# Patient Record
Sex: Female | Born: 1985 | Race: White | Hispanic: No | Marital: Single | Smoking: Current every day smoker
Health system: Southern US, Community
[De-identification: ages and names within clinical notes are randomized; demographics above are authoritative.]

---

## 2007-12-04 ENCOUNTER — Emergency Department (HOSPITAL_COMMUNITY): Admission: EM | Admit: 2007-12-04 | Discharge: 2007-12-04 | Payer: Self-pay | Admitting: Emergency Medicine

## 2008-05-24 ENCOUNTER — Emergency Department (HOSPITAL_COMMUNITY): Admission: EM | Admit: 2008-05-24 | Discharge: 2008-05-24 | Payer: Self-pay | Admitting: Emergency Medicine

## 2008-05-28 ENCOUNTER — Emergency Department (HOSPITAL_COMMUNITY): Admission: EM | Admit: 2008-05-28 | Discharge: 2008-05-29 | Payer: Self-pay | Admitting: Family Medicine

## 2010-03-30 ENCOUNTER — Emergency Department (HOSPITAL_COMMUNITY): Admission: EM | Admit: 2010-03-30 | Discharge: 2010-03-30 | Payer: Self-pay | Admitting: Emergency Medicine
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
## 2011-03-09 LAB — POCT PREGNANCY, URINE: Preg Test, Ur: NEGATIVE

## 2014-09-05 ENCOUNTER — Emergency Department (HOSPITAL_COMMUNITY)
Admission: EM | Admit: 2014-09-05 | Discharge: 2014-09-06 | Disposition: A | Discharge status: Home / Self Care | Payer: Worker's Compensation | Attending: Emergency Medicine | Admitting: Emergency Medicine

## 2014-09-05 ENCOUNTER — Encounter (HOSPITAL_COMMUNITY): Payer: Self-pay | Admitting: *Deleted

## 2014-09-05 DIAGNOSIS — Z23 Encounter for immunization: Secondary | ICD-10-CM | POA: Insufficient documentation

## 2014-09-05 DIAGNOSIS — Z720 Tobacco use: Secondary | ICD-10-CM | POA: Insufficient documentation

## 2014-09-05 DIAGNOSIS — S61452A Open bite of left hand, initial encounter: Secondary | ICD-10-CM | POA: Diagnosis not present

## 2014-09-05 DIAGNOSIS — Y998 Other external cause status: Secondary | ICD-10-CM | POA: Diagnosis not present

## 2014-09-05 DIAGNOSIS — W5501XA Bitten by cat, initial encounter: Secondary | ICD-10-CM | POA: Diagnosis not present

## 2014-09-05 DIAGNOSIS — Y9389 Activity, other specified: Secondary | ICD-10-CM | POA: Insufficient documentation

## 2014-09-05 DIAGNOSIS — Y9289 Other specified places as the place of occurrence of the external cause: Secondary | ICD-10-CM | POA: Insufficient documentation

## 2014-09-05 NOTE — ED Notes (Signed)
The pt  Was bitten by a cat on the lt hand 1215 today at the  [facility] office where she works.  Her hand is painful and sl swollen  lmp  2 weeks ago

## 2014-09-05 NOTE — ED Notes (Signed)
Pt c/o cat bite to L hand. Several puncture wounds noted, no bleeding, mild swelling noted. 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 CMS intact. Pt cleaned wounds with betadine

## 2014-09-06 MED ORDER — RABIES VACCINE, PCEC IM SUSR
1.0000 mL | Freq: Once | INTRAMUSCULAR | Status: AC
  Administered 2014-09-06: 1 mL via INTRAMUSCULAR
  Filled 2014-09-06: qty 1

## 2014-09-06 MED ORDER — RABIES IMMUNE GLOBULIN 150 UNIT/ML IM INJ
1800.0000 [IU] | INJECTION | Freq: Once | INTRAMUSCULAR | Status: AC
  Administered 2014-09-06: 1800 [IU]
  Filled 2014-09-06: qty 12

## 2014-09-06 MED ORDER — TETANUS-DIPHTH-ACELL PERTUSSIS 5-2.5-18.5 LF-MCG/0.5 IM SUSP
0.5000 mL | Freq: Once | INTRAMUSCULAR | Status: AC
  Administered 2014-09-06: 0.5 mL via INTRAMUSCULAR
  Filled 2014-09-06: qty 0.5

## 2014-09-06 MED ORDER — AMOXICILLIN-POT CLAVULANATE 875-125 MG PO TABS
1.0000 | ORAL_TABLET | Freq: Once | ORAL | Status: AC
  Administered 2014-09-06: 1 via ORAL
  Filled 2014-09-06: qty 1

## 2014-09-06 MED ORDER — AMOXICILLIN-POT CLAVULANATE 875-125 MG PO TABS: 1.0000 | ORAL_TABLET | Freq: Two times a day (BID) | ORAL | Status: AC

## 2014-09-06 NOTE — Discharge Instructions (Signed)
Animal Bite °An animal bite can result in a scratch on the skin, deep open cut, puncture of the skin, crush injury, or tearing away of the skin or a body part. Dogs are responsible for most animal bites. Children are bitten more often than adults. An animal bite can range from very mild to more serious. A small bite from your house pet is no cause for alarm. However, some animal bites can become infected or injure a bone or other tissue. You must seek medical care if: °· The skin is broken and bleeding does not slow down or stop after 15 minutes. °· The puncture is deep and difficult to clean (such as a cat bite). °· Pain, warmth, redness, or pus develops around the wound. °· The bite is from a stray animal or rodent. There may be a risk of rabies infection. °· The bite is from a snake, raccoon, skunk, fox, coyote, or bat. There may be a risk of rabies infection. °· The person bitten has a chronic illness such as diabetes, liver disease, or cancer, or the person takes medicine that lowers the immune system. °· There is concern about the location and severity of the bite. °It is important to clean and protect an animal bite wound right away to prevent infection. Follow these steps: °· Clean the wound with plenty of water and soap. °· Apply an antibiotic cream. °· Apply gentle pressure over the wound with a clean towel or gauze to slow or stop bleeding. °· Elevate the affected area above the heart to help stop any bleeding. °· Seek medical care. Getting medical care within 8 hours of the animal bite leads to the best possible outcome. °DIAGNOSIS  °Your caregiver will most likely: °· Take a detailed history of the animal and the bite injury. °· Perform a wound exam. °· Take your medical history. °Blood tests or X-rays may be performed. Sometimes, infected bite wounds are cultured and sent to a lab to identify the infectious bacteria.  °TREATMENT  °Medical treatment will depend on the location and type of animal bite as  well as the patient's medical history. Treatment may include: °· Wound care, such as cleaning and flushing the wound with saline solution, bandaging, and elevating the affected area. °· Antibiotics. °· Tetanus immunization. °· Rabies immunization. °· Leaving the wound open to heal. This is often done with animal bites, due to the high risk of infection. However, in certain cases, wound closure with stitches, wound adhesive, skin adhesive strips, or staples may be used. ° Infected bites that are left untreated may require intravenous (IV) antibiotics and surgical treatment in the hospital. °HOME CARE INSTRUCTIONS °· Follow your caregiver's instructions for wound care. °· Take all medicines as directed. °· If your caregiver prescribes antibiotics, take them as directed. Finish them even if you start to feel better. °· Follow up with your caregiver for further exams or immunizations as directed. °You may need a tetanus shot if: °· You cannot remember when you had your last tetanus shot. °· You have never had a tetanus shot. °· The injury broke your skin. °If you get a tetanus shot, your arm may swell, get red, and feel warm to the touch. This is common and not a problem. If you need a tetanus shot and you choose not to have one, there is a rare chance of getting tetanus. Sickness from tetanus can be serious. °SEEK MEDICAL CARE IF: °· You notice warmth, redness, soreness, swelling, pus discharge, or a bad   smell coming from the wound.  You have a red line on the skin coming from the wound.  You have a fever, chills, or a general ill feeling.  You have nausea or vomiting.  You have continued or worsening pain.  You have trouble moving the injured part.  You have other questions or concerns. MAKE SURE YOU:  Understand these instructions.  Will watch your condition.  Will get help right away if you are not doing well or get worse. Document Released: 02/14/2011 Document Revised: 08/21/2011 Document  Reviewed: 02/14/2011 [facility] Patient Information 2015 [place], [place]. This information is not intended to replace advice given to you by your health care provider. Make sure you discuss any questions you have with your health care provider.   Rabies  Rabies is a viral infection that can be spread to people from infected animals. The infection affects the brain and central nervous system. Once the disease develops, it almost always causes death. Because of this, when a person is bitten by an animal that may have rabies, treatment to prevent rabies often needs to be started whether or not the animal is known to be infected. Prompt treatment with the rabies vaccine and rabies immune globulin is very effective at preventing the infection from developing in people who have been exposed to the rabies virus. CAUSES  Rabies is caused by a virus that lives inside some animals. When a person is bitten by an infected animal, the rabies virus is spread to the person through the infected spit (saliva) of the animal. This virus can be carried by animals such as dogs, cats, skunks, bats, woodchucks, raccoons, coyotes, and foxes. SYMPTOMS  By the time symptoms appear, rabies is usually fatal for the person. Common symptoms include:  Headache.  Fever.  Fatigue and weakness.  Agitation.  Anxiety.  Confusion.  Unusual behavior, such as hyperactivity, fear of water (hydrophobia), or fear of air (aerophobia).  Hallucinations.  Insomnia.  Weakness in the arms or legs.  Difficulty swallowing. Most people get sick in 1-3 months after being bitten. This often varies and may depend on the location of the bite. The infection will take less time to develop if the bite occurred closer to the head.  DIAGNOSIS  To determine if a person is infected, several tests must be performed, such as:  A skin biopsy.  A saliva test.  A lumbar puncture to remove spinal fluid so it can be examined.  Blood  tests. TREATMENT  Treatment to prevent the infection from developing (post-exposure prophylaxis, PEP) is often started before knowing for sure if the person has been exposed to the rabies virus. PEP involves cleaning the wound, giving an antibody injection (rabies immune globulin), and giving a series of rabies vaccine injections. The series of injections are usually given over a two-week period. If possible, the animal that bit the person will be observed to see if it remains healthy. If the animal has been killed, it can be sent to a state laboratory and examined to see if the animal had rabies. If a person is bitten by a domestic animal (dog, cat, or ferret) that appears healthy and can be observed to see if it remains healthy, often no further treatment is necessary other than care of the wounds caused by the animal. Rabies is often a fatal illness once the infection develops in a person. Although a few people who developed rabies have survived after experimental treatment with certain drugs, all these survivors still had severe  nervous system problems after the treatment. This is why caregivers use extra caution and begin PEP treatment for people who have been bitten by animals that are possibly infected with rabies.  HOME CARE INSTRUCTIONS  If you were bitten by an unknown animal, make sure you know your caregiver's instructions for follow-up. If the animal was sent to a laboratory for examination, ask when the test results will be ready. Make sure you get the test results.  Take these steps to care for your wound:  Keep the wound clean, dry, and dressed as directed by your caregiver.  Keep the injured part elevated as much as possible.  Do not resume use of the affected area until directed.  Only take over-the-counter or prescription medicines as directed by your caregiver.  Keep all follow-up appointments as directed by your caregiver. PREVENTION  To prevent rabies, people need to reduce  their risk of having contact with infected animals.   Make sure your pets (dogs, cats, ferrets) are vaccinated against rabies. Keep these vaccinations up-to-date as directed by your veterinarian.  Supervise your pets when they are outside. Keep them away from wild animals.  Call your local animal control services to report any stray animals. These animals may not be vaccinated.  Stay away from stray or wild animals.  Consider getting the rabies vaccine (preexposure) if you are traveling to an area where rabies is common or if your job or activities involve possible contact with wild or stray animals. Discuss this with your caregiver. Document Released: 05/29/2005 Document Revised: 02/21/2012 Document Reviewed: 12/26/2011 [facility] Patient Information 2015 [place], [place]. This information is not intended to replace advice given to you by your health care provider. Make sure you discuss any questions you have with your health care provider.      [address]                                             [place], [place] [address]              [phone]                                  INSTRUCTIONS FOR THE PATIENT  Patient's Name: Audrey Wyatt                     Original Order Date:  09/06/2014  Medical Record Number: [id number]  ED Physician: . Primary Diagnosis: Rabies Exposure       PCP: No PCP Per Patient . RABIES VACCINE:  Patient Phone Number: (home) [phone] (home)    (cell)  No relevant phone numbers on file.    (work) [phone] (work) Species of Animal: cats (1) IMMUNOGLOBULIN INJECTION GIVEN IN THE ER?: yes   You have been seen in the Emergency Department for a possible rabies exposure.  You must return for the additional vaccine doses to the Urgent Care Center ([address]) next to [facility].  If needed, your immunoglobulin follow-up injections, need to be scheduled for the dates below. If your first visit should fall on a weekend day, please  come anytime between the hours of 9am-7pm.  DAY 0:  Date 09/06/14     To: Urgent Care, or Emergency Department  DAY 3:  Date 09/09/14  To:  Urgent Care, or Emergency Department  DAY 7:  Date 09/13/14    To:  Urgent Care, or Emergency Department  DAY 14:  Date 09/20/14   To:  Urgent Care, or Emergency Department  The 5th vaccine injection is considered for immune compromised patients only.  DAY 28:  Date 10/04/14  To:  Urgent Care, or Emergency Department  The Urgent Care Center is open from 8am-8pm Monday thru Friday and 9am-7pm on Saturdays and Sundays.  There will be a minimal fee for the injection that will be billed to your insurance company along with the charge for the vaccine.                                                                                                Date: 09/06/2014  Patient Signature: _________________________________________________  [facility] Copy   Patient Copy      Pharmacy Copy

## 2014-09-06 NOTE — ED Provider Notes (Signed)
CSN: [id number]     Arrival date & time 09/05/14  2207 History   First MD Initiated Contact with Patient 09/05/14 2307     Chief Complaint  Patient presents with  . Animal Bite     (Consider location/radiation/quality/duration/timing/severity/associated sxs/prior Treatment) HPI Comments: Patient presents to the emergency department with chief complaint of cat bite. 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 Patient states that they were treating a "outdoor cat," giving it its rabies vaccine this morning when she was bitten. Immune status up to Prior to the vaccination today is unknown. There are not able to observe the cat given that there are a mobile clinic. Patient reports moderate pain to the left hand. She denies any erythema or discharge. Eyes any fevers chills. Last tetanus shot is unknown.  The history is provided by the patient. No language interpreter was used.    History reviewed. No pertinent past medical history. History reviewed. No pertinent past surgical history. No family history on file. History  Substance Use Topics  . Smoking status: Current Every Day Smoker  . Smokeless tobacco: Not on file  . Alcohol Use: Yes   OB History    No data available     Review of Systems  Constitutional: Negative for fever and chills.  Respiratory: Negative for shortness of breath.   Cardiovascular: Negative for chest pain.  Gastrointestinal: Negative for nausea, vomiting, diarrhea and constipation.  Genitourinary: Negative for dysuria.  Skin: Positive for wound.      Allergies  Review of patient's allergies indicates no known allergies.  Home Medications   Prior to Admission medications   Not on File   BP 154/79 mmHg  Pulse 110  Temp(Src) 98.6 F (37 C)  Resp 16  SpO2 98%  LMP 08/22/2014 Physical Exam  Constitutional: She is oriented to person, place, and time. She appears well-developed and well-nourished.  HENT:  Head:  Normocephalic and atraumatic.  Eyes: Conjunctivae and EOM are normal.  Neck: Normal range of motion.  Cardiovascular: Normal rate and intact distal pulses.   Pulmonary/Chest: Effort normal.  Abdominal: She exhibits no distension.  Musculoskeletal: Normal range of motion.  Neurological: She is alert and oriented to person, place, and time.  Sensation intact  Skin: Skin is dry.  4 very small puncture wounds to the left hand over the hyperthenar eminence, no discharge, no erythema, moderately tender to palpation  Psychiatric: She has a normal mood and affect. Her behavior is normal. Judgment and thought content normal.  Nursing note and vitals reviewed.   ED Course  Procedures (including critical care time) Labs Review Labs Reviewed - No data to display  Imaging Review No results found.   EKG Interpretation None      MDM   Final diagnoses:  Cat bite, initial encounter    Patient with Bite to the hand. Will treat with Augmentin. As the cat has an unknown immunization history, and is an outdoor cat that cannot be observed, will treat the patient with rabies immunoglobulin and vaccine. Patient will also need a tetanus shot. Patient given specific return precautions. Urged very close monitoring of hand, as cat bites can become infected easily. Patient understands and agrees.    Roxy Horsemanobert Sincere Liuzzi, PA-C 09/06/14 0201  Devoria AlbeIva Knapp, MD 09/06/14 [phone]

## 2014-09-10 ENCOUNTER — Encounter (HOSPITAL_COMMUNITY): Payer: Self-pay | Admitting: *Deleted

## 2014-09-10 ENCOUNTER — Emergency Department (INDEPENDENT_AMBULATORY_CARE_PROVIDER_SITE_OTHER)
Admission: EM | Admit: 2014-09-10 | Discharge: 2014-09-10 | Disposition: A | Discharge status: Home / Self Care | Payer: Worker's Compensation | Source: Home / Self Care

## 2014-09-10 DIAGNOSIS — Z203 Contact with and (suspected) exposure to rabies: Secondary | ICD-10-CM | POA: Diagnosis not present

## 2014-09-10 MED ORDER — RABIES VACCINE, PCEC IM SUSR
INTRAMUSCULAR | Status: AC
  Filled 2014-09-10: qty 1

## 2014-09-10 MED ORDER — RABIES VACCINE, PCEC IM SUSR
1.0000 mL | Freq: Once | INTRAMUSCULAR | Status: AC
  Administered 2014-09-10: 1 mL via INTRAMUSCULAR

## 2014-09-10 NOTE — ED Notes (Signed)
[place]  [employer] vet [facility] on L hand 3/27.  Wound healing.  Taking Augmentin.  No side effects to vaccine or immune globulin.

## 2014-09-10 NOTE — Discharge Instructions (Signed)
Call if any problems.  Return on 4/2 for next vaccine. If unable to come that day come as soon as possible for next vaccine to stay on schedule

## 2014-09-17 ENCOUNTER — Emergency Department (INDEPENDENT_AMBULATORY_CARE_PROVIDER_SITE_OTHER)
Admission: EM | Admit: 2014-09-17 | Discharge: 2014-09-17 | Disposition: A | Discharge status: Home / Self Care | Payer: Worker's Compensation | Source: Home / Self Care

## 2014-09-17 ENCOUNTER — Encounter (HOSPITAL_COMMUNITY): Payer: Self-pay | Admitting: Emergency Medicine

## 2014-09-17 DIAGNOSIS — Z203 Contact with and (suspected) exposure to rabies: Secondary | ICD-10-CM

## 2014-09-17 MED ORDER — RABIES VACCINE, PCEC IM SUSR
1.0000 mL | Freq: Once | INTRAMUSCULAR | Status: AC
  Administered 2014-09-17: 1 mL via INTRAMUSCULAR

## 2014-09-17 MED ORDER — RABIES VACCINE, PCEC IM SUSR
INTRAMUSCULAR | Status: AC
  Filled 2014-09-17: qty 1

## 2014-09-17 NOTE — Discharge Instructions (Signed)
Return on 4/14 for day 14 injection

## 2014-09-17 NOTE — ED Notes (Signed)
Patient was bitten by an unknown cat in vet clinic 09/05/14.  Seen in ed on 3/27 and rabies series initiated.  Patient's say 3 was 3/30, but came to ucc on 3/31.  Patient's day 7 was 4/3, but patient arrived today.  Spoke to pharmacy, Mindi Junkermarsha.  States it is ok to give day 7 injection today, 4/7.  And instruct patient to return 7 days from now for 14 day injection-this will be 4/14.

## 2014-09-24 ENCOUNTER — Emergency Department (INDEPENDENT_AMBULATORY_CARE_PROVIDER_SITE_OTHER)
Admission: EM | Admit: 2014-09-24 | Discharge: 2014-09-24 | Disposition: A | Discharge status: Home / Self Care | Payer: Worker's Compensation | Source: Home / Self Care

## 2014-09-24 ENCOUNTER — Encounter (HOSPITAL_COMMUNITY): Payer: Self-pay | Admitting: Emergency Medicine

## 2014-09-24 DIAGNOSIS — Z203 Contact with and (suspected) exposure to rabies: Secondary | ICD-10-CM

## 2014-09-24 MED ORDER — RABIES VACCINE, PCEC IM SUSR
INTRAMUSCULAR | Status: AC
  Filled 2014-09-24: qty 1

## 2014-09-24 MED ORDER — RABIES VACCINE, PCEC IM SUSR
1.0000 mL | Freq: Once | INTRAMUSCULAR | Status: AC
  Administered 2014-09-24: 1 mL via INTRAMUSCULAR

## 2014-09-24 NOTE — ED Notes (Signed)
Pt here for last rabies injection.  Voices no concerns at this time.
